# Patient Record
Sex: Female | Born: 2000 | Race: Black or African American | Hispanic: No | Marital: Single
Health system: Midwestern US, Community
[De-identification: ages and names within clinical notes are randomized; demographics above are authoritative.]

## PROBLEM LIST (undated history)

## (undated) DIAGNOSIS — N643 Galactorrhea not associated with childbirth: Secondary | ICD-10-CM

---
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
## 2018-05-25 ENCOUNTER — Encounter (HOSPITAL_COMMUNITY): Payer: Self-pay | Admitting: Emergency Medicine

## 2018-05-25 ENCOUNTER — Emergency Department (HOSPITAL_COMMUNITY)
Admission: EM | Admit: 2018-05-25 | Discharge: 2018-05-25 | Disposition: A | Discharge status: Home / Self Care | Payer: Self-pay | Attending: Emergency Medicine | Admitting: Emergency Medicine

## 2018-05-25 ENCOUNTER — Other Ambulatory Visit: Payer: Self-pay

## 2018-05-25 ENCOUNTER — Ambulatory Visit (HOSPITAL_COMMUNITY)
Admission: EM | Admit: 2018-05-25 | Discharge: 2018-05-25 | Disposition: A | Discharge status: Home / Self Care | Payer: Self-pay | Attending: Family Medicine | Admitting: Family Medicine

## 2018-05-25 DIAGNOSIS — Z5321 Procedure and treatment not carried out due to patient leaving prior to being seen by health care provider: Secondary | ICD-10-CM | POA: Insufficient documentation

## 2018-05-25 DIAGNOSIS — G44311 Acute post-traumatic headache, intractable: Secondary | ICD-10-CM

## 2018-05-25 DIAGNOSIS — M542 Cervicalgia: Secondary | ICD-10-CM | POA: Insufficient documentation

## 2018-05-25 DIAGNOSIS — Y998 Other external cause status: Secondary | ICD-10-CM | POA: Insufficient documentation

## 2018-05-25 DIAGNOSIS — Y939 Activity, unspecified: Secondary | ICD-10-CM | POA: Insufficient documentation

## 2018-05-25 DIAGNOSIS — R51 Headache: Secondary | ICD-10-CM | POA: Insufficient documentation

## 2018-05-25 DIAGNOSIS — Y92410 Unspecified street and highway as the place of occurrence of the external cause: Secondary | ICD-10-CM | POA: Insufficient documentation

## 2018-05-25 MED ORDER — NAPROXEN 500 MG PO TABS: 500.0000 mg | ORAL_TABLET | Freq: Two times a day (BID) | ORAL | 0 refills | Status: DC | PRN
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
## 2018-05-25 NOTE — Discharge Instructions (Addendum)
Recommend start Naproxen 500mg  every 12 hours as needed for pain or headache. May apply warm compress to neck area for comfort. Recommend limit screen time to help decrease concussive symptoms. If any increase in pain or headache, vomiting, increased dizziness or vision changes occur, go to the ER ASAP. Otherwise, follow-up with your school health center in 4 to 5 days if minimal improvement.

## 2018-05-25 NOTE — ED Notes (Signed)
Patient called for room x 2 with no answer. 
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
## 2018-05-30 ENCOUNTER — Ambulatory Visit (HOSPITAL_COMMUNITY)
Admission: EM | Admit: 2018-05-30 | Discharge: 2018-05-30 | Disposition: A | Discharge status: Home / Self Care | Payer: Self-pay | Attending: Family Medicine | Admitting: Family Medicine

## 2018-05-30 ENCOUNTER — Ambulatory Visit (INDEPENDENT_AMBULATORY_CARE_PROVIDER_SITE_OTHER): Payer: Self-pay

## 2018-05-30 ENCOUNTER — Encounter (HOSPITAL_COMMUNITY): Payer: Self-pay

## 2018-05-30 DIAGNOSIS — M542 Cervicalgia: Secondary | ICD-10-CM

## 2018-05-30 DIAGNOSIS — G44319 Acute post-traumatic headache, not intractable: Secondary | ICD-10-CM

## 2018-05-30 MED ORDER — IBUPROFEN 800 MG PO TABS: 800.0000 mg | ORAL_TABLET | Freq: Three times a day (TID) | ORAL | 0 refills | Status: DC

## 2018-05-30 MED ORDER — CYCLOBENZAPRINE HCL 5 MG PO TABS: 5.0000 mg | ORAL_TABLET | Freq: Three times a day (TID) | ORAL | 0 refills | Status: DC | PRN

## 2018-05-30 NOTE — Discharge Instructions (Signed)
Stop Naprosyn Take ibuprofen 3 times a day with food, as needed for pain Take cyclobenzaprine as needed for muscle relaxer This can cause drowsiness, it is useful to take at nighttime to help with sleep Expect gradual improvement over the next couple of weeks Follow-up with your primary care provider if you fail to improve
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
## 2018-05-31 ENCOUNTER — Telehealth (HOSPITAL_COMMUNITY): Payer: Self-pay

## 2018-05-31 MED ORDER — CYCLOBENZAPRINE HCL 5 MG PO TABS: 5.0000 mg | ORAL_TABLET | Freq: Three times a day (TID) | ORAL | 0 refills | Status: DC | PRN

## 2018-06-06 ENCOUNTER — Telehealth (HOSPITAL_COMMUNITY): Payer: Self-pay

## 2019-09-10 ENCOUNTER — Ambulatory Visit: Payer: Self-pay | Attending: Family

## 2019-09-10 DIAGNOSIS — Z23 Encounter for immunization: Secondary | ICD-10-CM | POA: Insufficient documentation

## 2019-09-11 NOTE — Progress Notes (Signed)
   Covid-19 Vaccination Clinic  Name:  Michelle Weaver    MRN: [id number] DOB: [birth year]  09/11/2019  [name] was observed post Covid-19 immunization for 15 minutes without incidence. She was provided with Vaccine Information Sheet and instruction to access the V-Safe system.   [name] was instructed to call 911 with any severe reactions post vaccine: Marland Kitchen Difficulty breathing  . Swelling of your face and throat  . A fast heartbeat  . A bad rash all over your body  . Dizziness and weakness    Immunizations Administered    Name Date Dose VIS Date Route   Moderna COVID-19 Vaccine 09/10/2019  5:14 PM 0.5 mL 06/30/2019 Intramuscular   Manufacturer: Moderna   Lot: [id number]   NDC: 75170-017-49

## 2019-10-13 ENCOUNTER — Ambulatory Visit: Payer: Self-pay | Attending: Family

## 2019-10-13 DIAGNOSIS — Z23 Encounter for immunization: Secondary | ICD-10-CM

## 2019-10-13 NOTE — Progress Notes (Signed)
   Covid-19 Vaccination Clinic  Name:  Michelle Weaver    MRN: [id number] DOB: May 19, 2001  10/13/2019  [name] was observed post Covid-19 immunization for 15 minutes without incident. She was provided with Vaccine Information Sheet and instruction to access the V-Safe system.   [name] was instructed to call 911 with any severe reactions post vaccine: Marland Kitchen Difficulty breathing  . Swelling of face and throat  . A fast heartbeat  . A bad rash all over body  . Dizziness and weakness   Immunizations Administered    Name Date Dose VIS Date Route   Moderna COVID-19 Vaccine 10/13/2019  1:25 PM 0.5 mL 06/30/2019 Intramuscular   Manufacturer: Moderna   Lot: [id number]   NDC: 10301-314-38

## 2020-05-26 ENCOUNTER — Ambulatory Visit (HOSPITAL_COMMUNITY)
Admission: EM | Admit: 2020-05-26 | Discharge: 2020-05-26 | Disposition: A | Discharge status: Home / Self Care | Payer: BC Managed Care – PPO | Attending: Family Medicine | Admitting: Family Medicine

## 2020-05-26 ENCOUNTER — Encounter (HOSPITAL_COMMUNITY): Payer: Self-pay | Admitting: Emergency Medicine

## 2020-05-26 ENCOUNTER — Other Ambulatory Visit: Payer: Self-pay

## 2020-05-26 DIAGNOSIS — S39012A Strain of muscle, fascia and tendon of lower back, initial encounter: Secondary | ICD-10-CM

## 2020-05-26 DIAGNOSIS — S161XXA Strain of muscle, fascia and tendon at neck level, initial encounter: Secondary | ICD-10-CM | POA: Diagnosis not present

## 2020-05-26 MED ORDER — CYCLOBENZAPRINE HCL 10 MG PO TABS: ORAL_TABLET | ORAL | 0 refills | Status: AC

## 2020-05-26 MED ORDER — NAPROXEN 500 MG PO TABS: 500.0000 mg | ORAL_TABLET | Freq: Two times a day (BID) | ORAL | 0 refills | Status: AC

## 2020-05-26 NOTE — Discharge Instructions (Signed)
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
## 2020-08-26 ENCOUNTER — Ambulatory Visit: Payer: BC Managed Care – PPO | Attending: Family

## 2020-08-26 DIAGNOSIS — Z23 Encounter for immunization: Secondary | ICD-10-CM

## 2020-12-15 NOTE — Progress Notes (Signed)
   [facility]  Name:  Michelle Weaver    MRN: [id number] DOB: [birth year]  12/15/2020  Michelle Weaver was observed post Covid-19 immunization for 15 minutes without incident. She was provided with Vaccine Information Sheet and instruction to access the V-Safe system.   Michelle Weaver was instructed to call 911 with any severe reactions post vaccine: Marland Kitchen Difficulty breathing  . Swelling of face and throat  . A fast heartbeat  . A bad rash all over body  . Dizziness and weakness   Immunizations Administered    Name Date Dose VIS Date Route   Moderna Covid-19 Booster Vaccine 08/26/2020 10:15 PM 0.25 mL 05/18/2020 Intramuscular   Manufacturer: Moderna   Lot: [id number]   NDC: 52841-324-40
